# Patient Record
Sex: Male | Born: 1995 | Race: White | Hispanic: No | Marital: Single | Smoking: Never smoker
Health system: Southern US, Community
[De-identification: ages and names within clinical notes are randomized; demographics above are authoritative.]

## PROBLEM LIST (undated history)

## (undated) DIAGNOSIS — K2990 Gastroduodenitis, unspecified, without bleeding: Secondary | ICD-10-CM

## (undated) HISTORY — PX: WISDOM TOOTH EXTRACTION: SHX21

## (undated) HISTORY — DX: Gastroduodenitis, unspecified, without bleeding: K29.90

---
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
## 2014-02-04 IMAGING — US US ABDOMEN LIMITED
1 series · 14 of 25 positions shown · non-contrast
Comparison: None.

CLINICAL DATA: Elevated LFTs

EXAM:
US ABDOMEN LIMITED - RIGHT UPPER QUADRANT

[Series 1: us abdomen limited · 0.17mm/px · 14 of 50 slices shown]
[im 1/50]
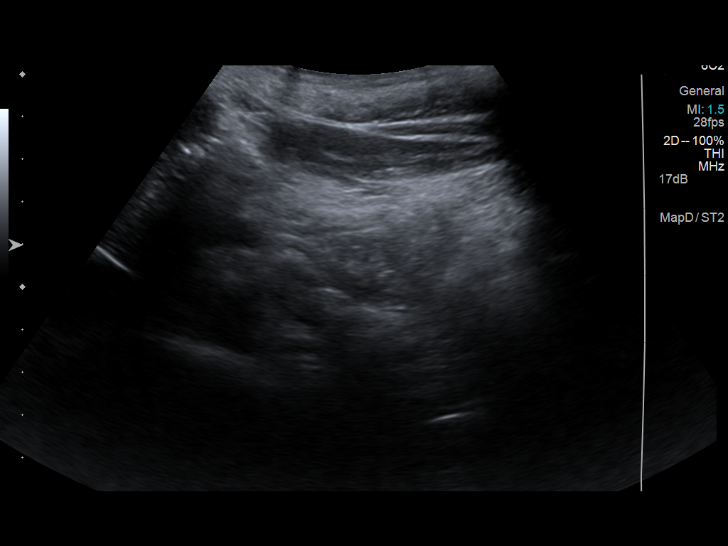
[im 5/50]
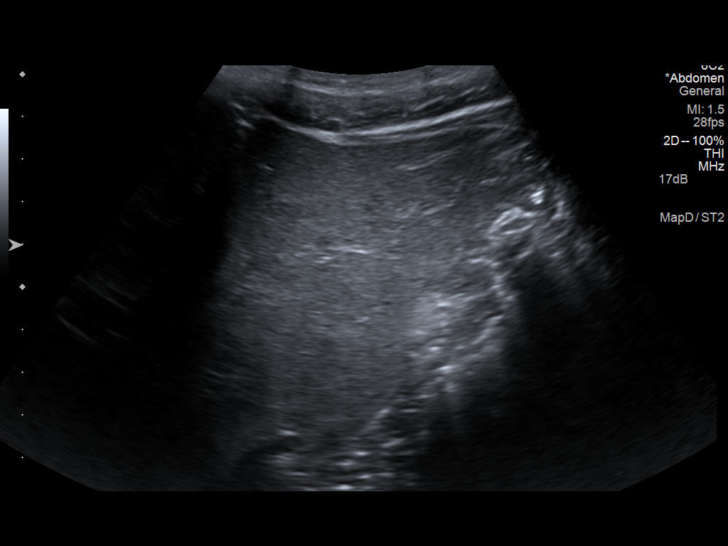
[im 9/50]
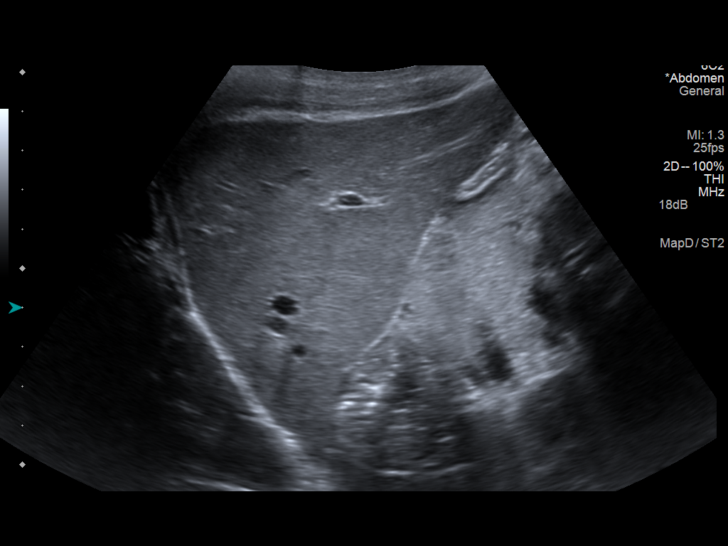
[im 13/50]
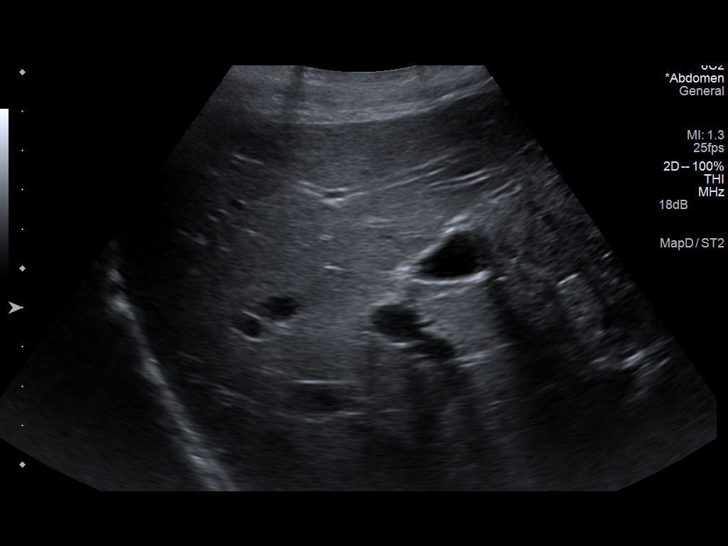
[im 17/50]
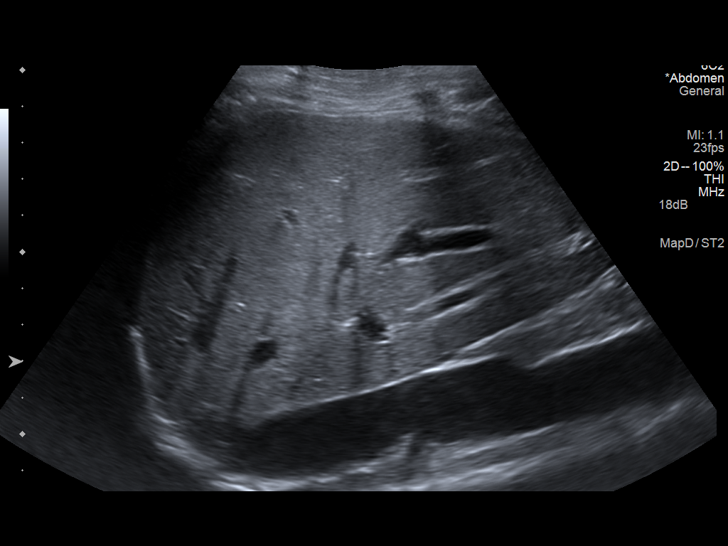
[im 19/50]
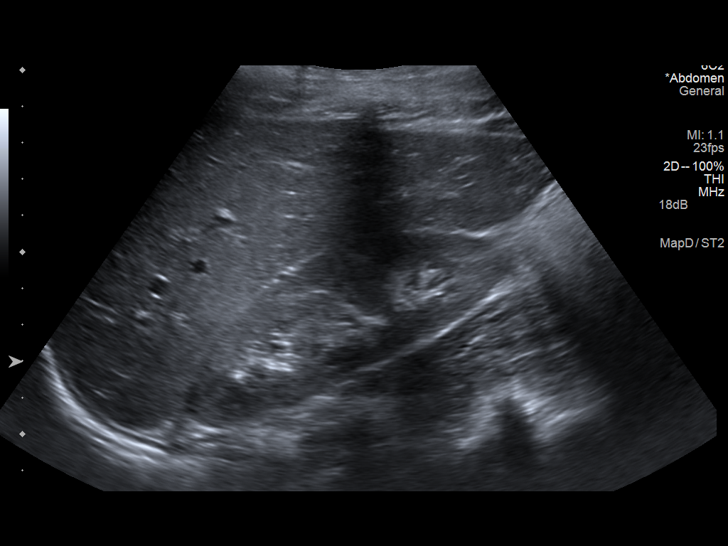
[im 23/50]
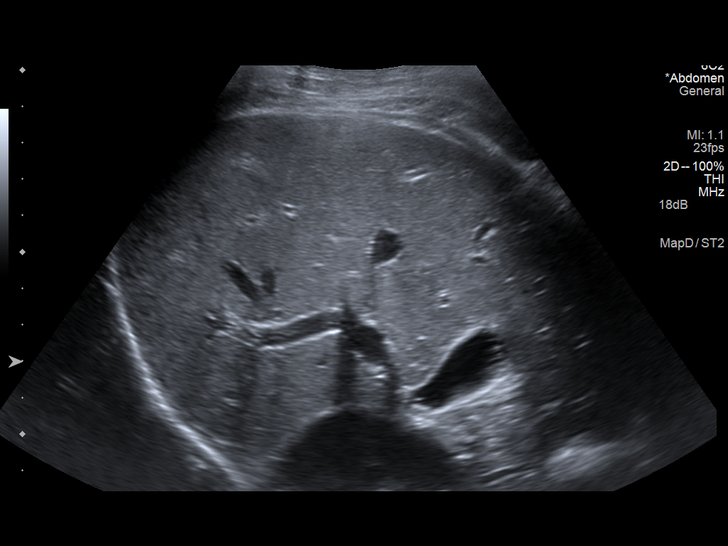
[im 27/50]
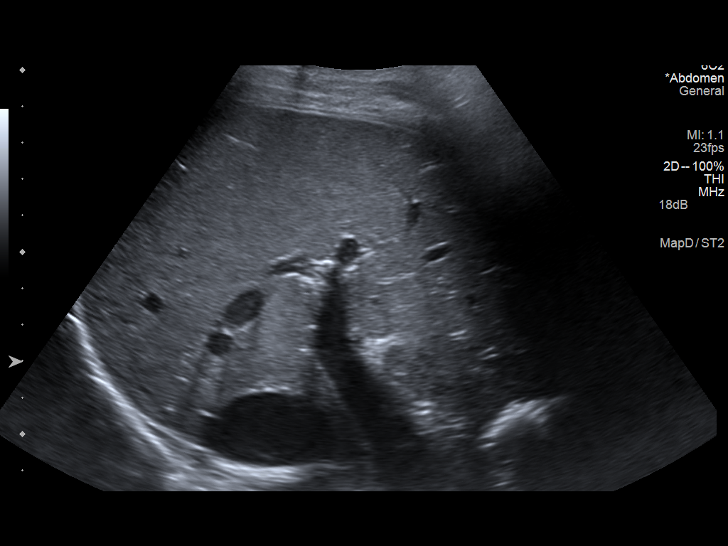
[im 31/50]
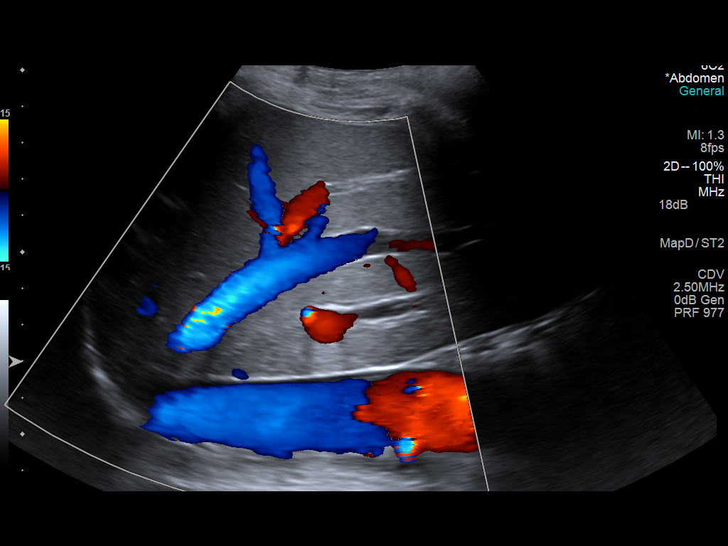
[im 33/50]
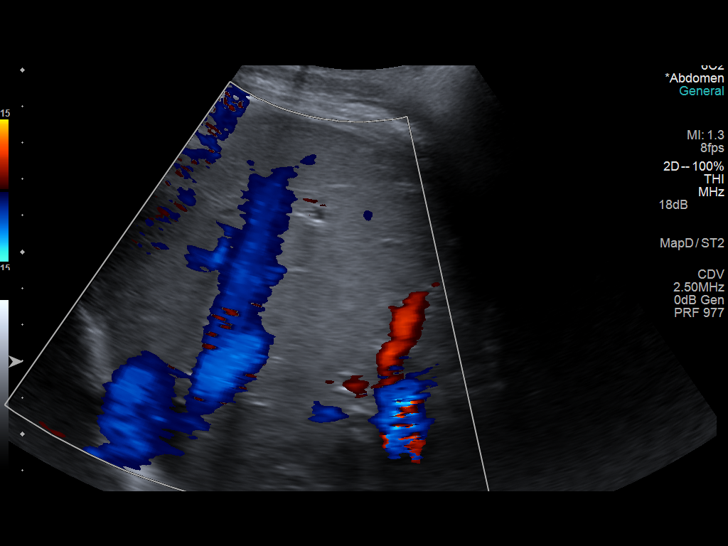
[im 37/50]
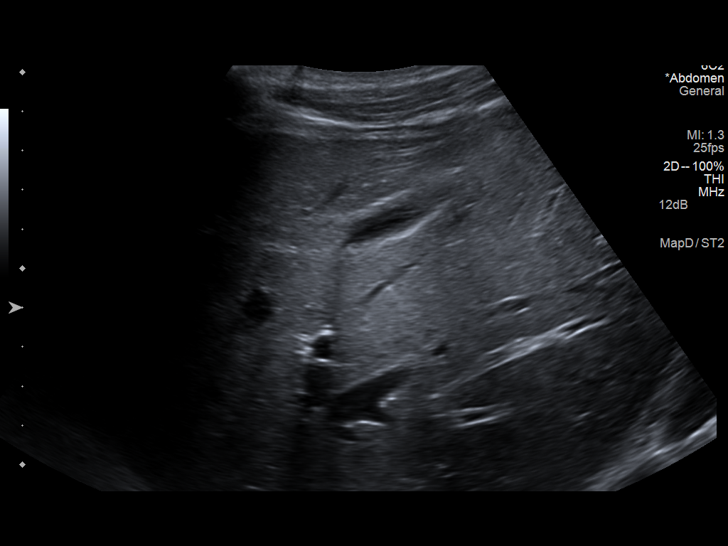
[im 41/50]
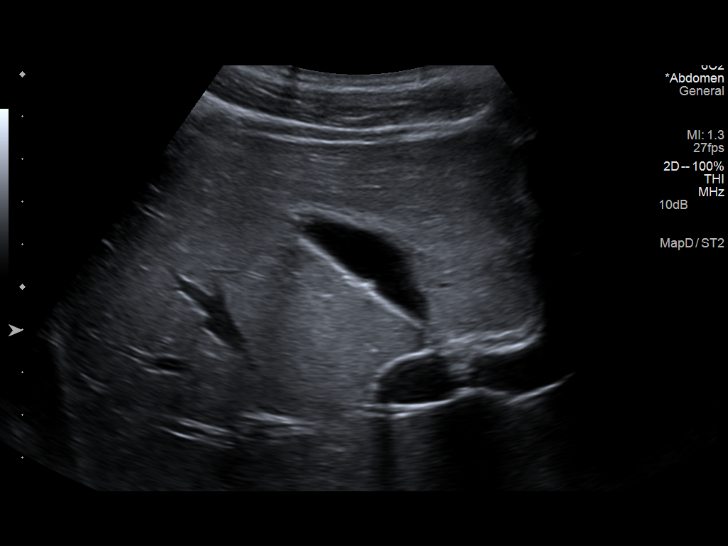
[im 45/50]
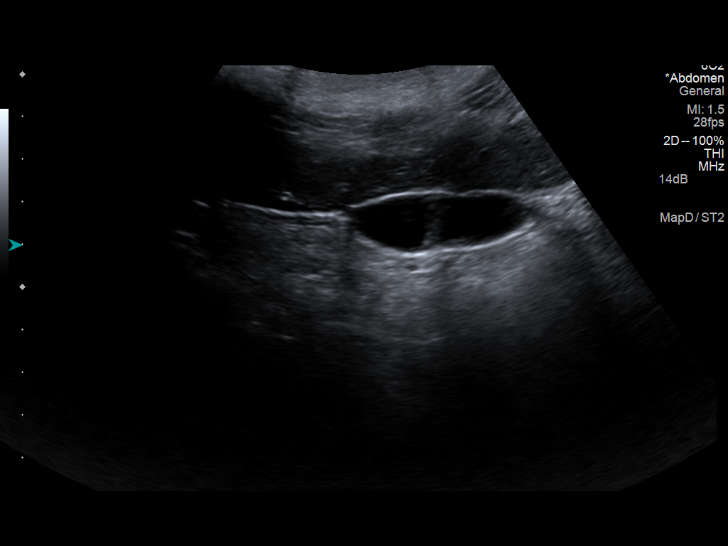
[im 50/50]
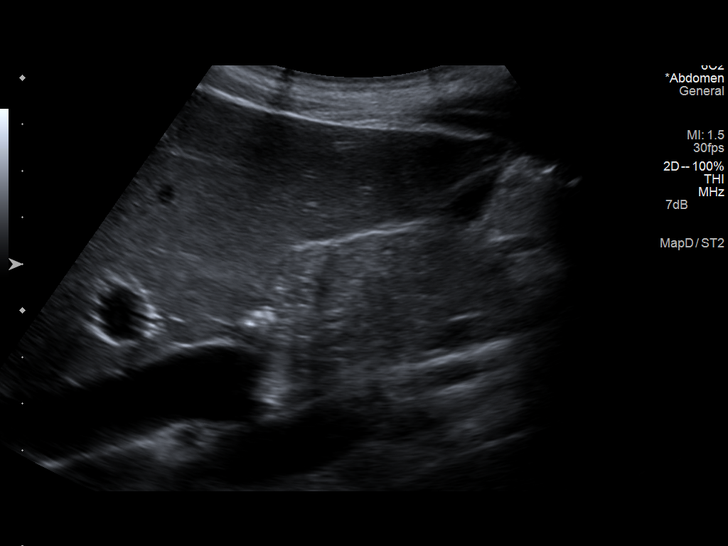

[14 of 25 positions shown; findings below may reference images not displayed]

FINDINGS: Gallbladder

No gallstones or wall thickening visualized, measured at 1.7 mm in
thickness. No sonographic Murphy sign noted.

Common bile duct

Diameter: 1.7 mm

Liver:

No focal lesion identified. Within normal limits in parenchymal
echogenicity.
IMPRESSION: Negative right upper quadrant abdominal ultrasound

## 2014-02-17 ENCOUNTER — Other Ambulatory Visit: Payer: Self-pay

## 2014-02-17 DIAGNOSIS — R7989 Other specified abnormal findings of blood chemistry: Secondary | ICD-10-CM

## 2014-02-17 DIAGNOSIS — R945 Abnormal results of liver function studies: Principal | ICD-10-CM

## 2014-12-01 ENCOUNTER — Encounter (HOSPITAL_COMMUNITY): Payer: Self-pay | Admitting: *Deleted

## 2014-12-01 ENCOUNTER — Emergency Department (HOSPITAL_COMMUNITY)
Admission: EM | Admit: 2014-12-01 | Discharge: 2014-12-01 | Disposition: A | Discharge status: Home / Self Care | Payer: Medicaid Other | Attending: Emergency Medicine | Admitting: Emergency Medicine

## 2014-12-01 ENCOUNTER — Emergency Department (HOSPITAL_COMMUNITY): Payer: Medicaid Other

## 2014-12-01 DIAGNOSIS — R091 Pleurisy: Secondary | ICD-10-CM | POA: Diagnosis not present

## 2014-12-01 DIAGNOSIS — R079 Chest pain, unspecified: Secondary | ICD-10-CM | POA: Diagnosis present

## 2014-12-01 DIAGNOSIS — J45909 Unspecified asthma, uncomplicated: Secondary | ICD-10-CM | POA: Insufficient documentation

## 2014-12-01 DIAGNOSIS — Z79899 Other long term (current) drug therapy: Secondary | ICD-10-CM | POA: Insufficient documentation

## 2014-12-01 LAB — BASIC METABOLIC PANEL
Anion gap: 9 (ref 5–15)
BUN: 15 mg/dL (ref 6–20)
CO2: 27 mmol/L (ref 22–32)
CREATININE: 0.93 mg/dL (ref 0.61–1.24)
Calcium: 9.5 mg/dL (ref 8.9–10.3)
Chloride: 103 mmol/L (ref 101–111)
Glucose, Bld: 91 mg/dL (ref 65–99)
Potassium: 3.8 mmol/L (ref 3.5–5.1)
SODIUM: 139 mmol/L (ref 135–145)

## 2014-12-01 LAB — BRAIN NATRIURETIC PEPTIDE: B NATRIURETIC PEPTIDE 5: 11 pg/mL (ref 0.0–100.0)

## 2014-12-01 LAB — D-DIMER, QUANTITATIVE: D-Dimer, Quant: <0.27 ug/mL-FEU (ref 0.00–0.48)

## 2014-12-01 LAB — TROPONIN I

## 2014-12-01 MED ORDER — DEXAMETHASONE SODIUM PHOSPHATE 10 MG/ML IJ SOLN
10.0000 mg | Freq: Once | INTRAMUSCULAR | Status: AC
  Administered 2014-12-01: 10 mg via INTRAVENOUS
  Filled 2014-12-01: qty 1

## 2014-12-01 MED ORDER — TRAMADOL HCL 50 MG PO TABS
50.0000 mg | ORAL_TABLET | Freq: Once | ORAL | Status: AC
  Administered 2014-12-01: 50 mg via ORAL
  Filled 2014-12-01: qty 1

## 2014-12-01 MED ORDER — TRAMADOL HCL 50 MG PO TABS: 50.0000 mg | ORAL_TABLET | Freq: Four times a day (QID) | ORAL | Status: DC | PRN

## 2014-12-01 MED ORDER — KETOROLAC TROMETHAMINE 30 MG/ML IJ SOLN
30.0000 mg | Freq: Once | INTRAMUSCULAR | Status: AC
  Administered 2014-12-01: 30 mg via INTRAVENOUS
  Filled 2014-12-01: qty 1

## 2014-12-01 MED ORDER — NAPROXEN 500 MG PO TABS: 500.0000 mg | ORAL_TABLET | Freq: Two times a day (BID) | ORAL | Status: DC

## 2014-12-01 NOTE — Discharge Instructions (Signed)
Pleurisy Pleurisy is an inflammation and swelling of the lining of the lungs (pleura). Because of this inflammation, it hurts to breathe. It can be aggravated by coughing, laughing, or deep breathing. Pleurisy is often caused by an underlying infection or disease.  HOME CARE INSTRUCTIONS  Monitor your pleurisy for any changes. The following actions may help to alleviate any discomfort you are experiencing:  Medicine may help with pain. Only take over-the-counter or prescription medicines for pain, discomfort, or fever as directed by your health care provider.  Only take antibiotic medicine as directed. Make sure to finish it even if you start to feel better. SEEK MEDICAL CARE IF:   Your pain is not controlled with medicine or is increasing.  You have an increase in pus-like (purulent) secretions brought up with coughing. SEEK IMMEDIATE MEDICAL CARE IF:   You have blue or dark lips, fingernails, or toenails.  You are coughing up blood.  You have increased difficulty breathing.  You have continuing pain unrelieved by medicine or pain lasting more than 1 week.  You have pain that radiates into your neck, arms, or jaw.  You develop increased shortness of breath or wheezing.  You develop a fever, rash, vomiting, fainting, or other serious symptoms. MAKE SURE YOU:  Understand these instructions.   Will watch your condition.   Will get help right away if you are not doing well or get worse.  Document Released: 05/23/2005 Document Revised: 01/23/2013 Document Reviewed: 11/04/2012 [facility] Patient Information 2015 [place], [place]. This information is not intended to replace advice given to you by your health care provider. Make sure you discuss any questions you have with your health care provider.  Naproxen and naproxen sodium oral immediate-release tablets What is this medicine? NAPROXEN (na PROX en) is a non-steroidal anti-inflammatory drug (NSAID). It is used to reduce swelling  and to treat pain. This medicine may be used for dental pain, headache, or painful monthly periods. It is also used for painful joint and muscular problems such as arthritis, tendinitis, bursitis, and gout. This medicine may be used for other purposes; ask your health care provider or pharmacist if you have questions. COMMON BRAND NAME(S): Aflaxen, Aleve, Aleve Arthritis, All Day Relief, Anaprox, Anaprox DS, Naprosyn What should I tell my health care provider before I take this medicine? They need to know if you have any of these conditions: -asthma -cigarette smoker -drink more than 3 alcohol containing drinks a day -heart disease or circulation problems such as heart failure or leg edema (fluid retention) -high blood pressure -kidney disease -liver disease -stomach bleeding or ulcers -an unusual or allergic reaction to naproxen, aspirin, other NSAIDs, other medicines, foods, dyes, or preservatives -pregnant or trying to get pregnant -breast-feeding How should I use this medicine? Take this medicine by mouth with a glass of water. Follow the directions on the prescription label. Take it with food if your stomach gets upset. Try to not lie down for at least 10 minutes after you take it. Take your medicine at regular intervals. Do not take your medicine more often than directed. Long-term, continuous use may increase the risk of heart attack or stroke. A special MedGuide will be given to you by the pharmacist with each prescription and refill. Be sure to read this information carefully each time. Talk to your pediatrician regarding the use of this medicine in children. Special care may be needed. Overdosage: If you think you have taken too much of this medicine contact a poison control center or  emergency room at once. NOTE: This medicine is only for you. Do not share this medicine with others. What if I miss a dose? If you miss a dose, take it as soon as you can. If it is almost time for your  next dose, take only that dose. Do not take double or extra doses. What may interact with this medicine? -alcohol -aspirin -cidofovir -diuretics -lithium -methotrexate -other drugs for inflammation like ketorolac or prednisone -pemetrexed -probenecid -warfarin This list may not describe all possible interactions. Give your health care provider a list of all the medicines, herbs, non-prescription drugs, or dietary supplements you use. Also tell them if you smoke, drink alcohol, or use illegal drugs. Some items may interact with your medicine. What should I watch for while using this medicine? Tell your doctor or health care professional if your pain does not get better. Talk to your doctor before taking another medicine for pain. Do not treat yourself. This medicine does not prevent heart attack or stroke. In fact, this medicine may increase the chance of a heart attack or stroke. The chance may increase with longer use of this medicine and in people who have heart disease. If you take aspirin to prevent heart attack or stroke, talk with your doctor or health care professional. Do not take other medicines that contain aspirin, ibuprofen, or naproxen with this medicine. Side effects such as stomach upset, nausea, or ulcers may be more likely to occur. Many medicines available without a prescription should not be taken with this medicine. This medicine can cause ulcers and bleeding in the stomach and intestines at any time during treatment. Do not smoke cigarettes or drink alcohol. These increase irritation to your stomach and can make it more susceptible to damage from this medicine. Ulcers and bleeding can happen without warning symptoms and can cause death. You may get drowsy or dizzy. Do not drive, use machinery, or do anything that needs mental alertness until you know how this medicine affects you. Do not stand or sit up quickly, especially if you are an older patient. This reduces the risk of  dizzy or fainting spells. This medicine can cause you to bleed more easily. Try to avoid damage to your teeth and gums when you brush or floss your teeth. What side effects may I notice from receiving this medicine? Side effects that you should report to your doctor or health care professional as soon as possible: -black or bloody stools, blood in the urine or vomit -blurred vision -chest pain -difficulty breathing or wheezing -nausea or vomiting -severe stomach pain -skin rash, skin redness, blistering or peeling skin, hives, or itching -slurred speech or weakness on one side of the body -swelling of eyelids, throat, lips -unexplained weight gain or swelling -unusually weak or tired -yellowing of eyes or skin Side effects that usually do not require medical attention (report to your doctor or health care professional if they continue or are bothersome): -constipation -headache -heartburn This list may not describe all possible side effects. Call your doctor for medical advice about side effects. You may report side effects to FDA at 1-800-FDA-1088. Where should I keep my medicine? Keep out of the reach of children. Store at room temperature between 15 and 30 degrees C (59 and 86 degrees F). Keep container tightly closed. Throw away any unused medicine after the expiration date. NOTE: This sheet is a summary. It may not cover all possible information. If you have questions about this medicine, talk to your doctor,  pharmacist, or health care provider.  2015, Elsevier/Gold Standard. (2009-05-25 20:10:16)  Tramadol tablets What is this medicine? TRAMADOL (TRA ma dole) is a pain reliever. It is used to treat moderate to severe pain in adults. This medicine may be used for other purposes; ask your health care provider or pharmacist if you have questions. COMMON BRAND NAME(S): Ultram What should I tell my health care provider before I take this medicine? They need to know if you have any  of these conditions: -brain tumor -depression -drug abuse or addiction -head injury -if you frequently drink alcohol containing drinks -kidney disease or trouble passing urine -liver disease -lung disease, asthma, or breathing problems -seizures or epilepsy -suicidal thoughts, plans, or attempt; a previous suicide attempt by you or a family member -an unusual or allergic reaction to tramadol, codeine, other medicines, foods, dyes, or preservatives -pregnant or trying to get pregnant -breast-feeding How should I use this medicine? Take this medicine by mouth with a full glass of water. Follow the directions on the prescription label. If the medicine upsets your stomach, take it with food or milk. Do not take more medicine than you are told to take. Talk to your pediatrician regarding the use of this medicine in children. Special care may be needed. Overdosage: If you think you have taken too much of this medicine contact a poison control center or emergency room at once. NOTE: This medicine is only for you. Do not share this medicine with others. What if I miss a dose? If you miss a dose, take it as soon as you can. If it is almost time for your next dose, take only that dose. Do not take double or extra doses. What may interact with this medicine? Do not take this medicine with any of the following medications: -MAOIs like Carbex, Eldepryl, Marplan, Nardil, and Parnate This medicine may also interact with the following medications: -alcohol or medicines that contain alcohol -antihistamines -benzodiazepines -bupropion -carbamazepine or oxcarbazepine -clozapine -cyclobenzaprine -digoxin -furazolidone -linezolid -medicines for depression, anxiety, or psychotic disturbances -medicines for migraine headache like almotriptan, eletriptan, frovatriptan, naratriptan, rizatriptan, sumatriptan, zolmitriptan -medicines for pain like pentazocine, buprenorphine, butorphanol, meperidine,  nalbuphine, and propoxyphene -medicines for sleep -muscle relaxants -naltrexone -phenobarbital -phenothiazines like perphenazine, thioridazine, chlorpromazine, mesoridazine, fluphenazine, prochlorperazine, promazine, and trifluoperazine -procarbazine -warfarin This list may not describe all possible interactions. Give your health care provider a list of all the medicines, herbs, non-prescription drugs, or dietary supplements you use. Also tell them if you smoke, drink alcohol, or use illegal drugs. Some items may interact with your medicine. What should I watch for while using this medicine? Tell your doctor or health care professional if your pain does not go away, if it gets worse, or if you have new or a different type of pain. You may develop tolerance to the medicine. Tolerance means that you will need a higher dose of the medicine for pain relief. Tolerance is normal and is expected if you take this medicine for a long time. Do not suddenly stop taking your medicine because you may develop a severe reaction. Your body becomes used to the medicine. This does NOT mean you are addicted. Addiction is a behavior related to getting and using a drug for a non-medical reason. If you have pain, you have a medical reason to take pain medicine. Your doctor will tell you how much medicine to take. If your doctor wants you to stop the medicine, the dose will be slowly lowered over time to avoid  any side effects. You may get drowsy or dizzy. Do not drive, use machinery, or do anything that needs mental alertness until you know how this medicine affects you. Do not stand or sit up quickly, especially if you are an older patient. This reduces the risk of dizzy or fainting spells. Alcohol can increase or decrease the effects of this medicine. Avoid alcoholic drinks. You may have constipation. Try to have a bowel movement at least every 2 to 3 days. If you do not have a bowel movement for 3 days, call your doctor  or health care professional. Your mouth may get dry. Chewing sugarless gum or sucking hard candy, and drinking plenty of water may help. Contact your doctor if the problem does not go away or is severe. What side effects may I notice from receiving this medicine? Side effects that you should report to your doctor or health care professional as soon as possible: -allergic reactions like skin rash, itching or hives, swelling of the face, lips, or tongue -breathing difficulties, wheezing -confusion -itching -light headedness or fainting spells -redness, blistering, peeling or loosening of the skin, including inside the mouth -seizures Side effects that usually do not require medical attention (report to your doctor or health care professional if they continue or are bothersome): -constipation -dizziness -drowsiness -headache -nausea, vomiting This list may not describe all possible side effects. Call your doctor for medical advice about side effects. You may report side effects to FDA at 1-800-FDA-1088. Where should I keep my medicine? Keep out of the reach of children. Store at room temperature between 15 and 30 degrees C (59 and 86 degrees F). Keep container tightly closed. Throw away any unused medicine after the expiration date. NOTE: This sheet is a summary. It may not cover all possible information. If you have questions about this medicine, talk to your doctor, pharmacist, or health care provider.  2015, Elsevier/Gold Standard. (2010-02-03 11:55:44)

## 2014-12-01 NOTE — ED Provider Notes (Signed)
CSN: [id number]     Arrival date & time 12/01/14  0159 History   First MD Initiated Contact with Patient 12/01/14 0229     Chief Complaint  Patient presents with  . Chest Pain     (Consider location/radiation/quality/duration/timing/severity/associated sxs/prior Treatment) Patient is a 19 y.o. male presenting with chest pain. The history is provided by the patient.  Chest Pain He had sudden onset of left anterior chest pain about 3 ounces sig oh. Pain started while he was watching television. It is worse with a deep breath but not worse with movement. Some radiation of pain towards his left shoulder. He denies associated dyspnea, nausea, diaphoresis. There's been no cough or fever. He's never had pain like this before. He has not tried anything to treat it. He denies any recent surgery or long distance travel and is a nonsmoker. He does have history of asthma.  Past Medical History  Diagnosis Date  . Asthma    Past Surgical History  Procedure Laterality Date  . Wisdom tooth extraction     Family History  Problem Relation Age of Onset  . Asthma    . Colon cancer Neg Hx    History  Substance Use Topics  . Smoking status: Never Smoker   . Smokeless tobacco: Not on file  . Alcohol Use: No    Review of Systems  Cardiovascular: Positive for chest pain.  All other systems reviewed and are negative.     Allergies  Review of patient's allergies indicates no known allergies.  Home Medications   Prior to Admission medications   Medication Sig Start Date End Date Taking? Authorizing Provider  albuterol (PROVENTIL) (2.5 MG/3ML) 0.083% nebulizer solution Take 2.5 mg by nebulization every 6 (six) hours as needed. For shortness of breath    Yes Historical Provider, MD  albuterol (VENTOLIN HFA) 108 (90 BASE) MCG/ACT inhaler Inhale 2 puffs into the lungs daily as needed. For shortness of breath    Yes Historical Provider, MD  cetirizine (ZYRTEC) 10 MG tablet Take 10 mg by mouth at  bedtime.     Yes Historical Provider, MD  montelukast (SINGULAIR) 5 MG chewable tablet Chew 5 mg by mouth daily.     Yes Historical Provider, MD   BP 148/94 mmHg  Pulse 95  Temp(Src) 98.3 F (36.8 C) (Oral)  Resp 18  Ht 5\' 8"  (1.727 m)  Wt 125 lb (56.7 kg)  BMI 19.01 kg/m2  SpO2 100% Physical Exam  Nursing note and vitals reviewed.  19 year old male, resting comfortably and in no acute distress. Vital signs are significant for hypertension. Oxygen saturation is 100%, which is normal. Head is normocephalic and atraumatic. PERRLA, EOMI. Oropharynx is clear. Neck is nontender and supple without adenopathy or JVD. Back is nontender and there is no CVA tenderness. Lungs are clear without rales, wheezes, or rhonchi. Chest is nontender. Heart has regular rate and rhythm without murmur. Abdomen is soft, flat, nontender without masses or hepatosplenomegaly and peristalsis is normoactive. Extremities have no cyanosis or edema, full range of motion is present. Skin is warm and dry without rash. Neurologic: Mental status is normal, cranial nerves are intact, there are no motor or sensory deficits.  ED Course  Procedures (including critical care time) Labs Review Results for orders placed or performed during the hospital encounter of 12/01/14  BNP (order ONLY if patient complains of dyspnea/SOB AND you have documented it for THIS visit)  Result Value Ref Range   B Natriuretic Peptide 11.0  0.0 - 100.0 pg/mL  Basic metabolic panel  Result Value Ref Range   Sodium 139 135 - 145 mmol/L   Potassium 3.8 3.5 - 5.1 mmol/L   Chloride 103 101 - 111 mmol/L   CO2 27 22 - 32 mmol/L   Glucose, Bld 91 65 - 99 mg/dL   BUN 15 6 - 20 mg/dL   Creatinine, Ser 8.11 0.61 - 1.24 mg/dL   Calcium 9.5 8.9 - 91.4 mg/dL   GFR calc non Af Amer >60 >60 mL/min   GFR calc Af Amer >60 >60 mL/min   Anion gap 9 5 - 15  Troponin I  Result Value Ref Range   Troponin I <0.03 <0.031 ng/mL  D-dimer, quantitative   Result Value Ref Range   D-Dimer, Quant <0.27 0.00 - 0.48 ug/mL-FEU  Imaging Review Dg Chest Portable 1 View  12/01/2014   CLINICAL DATA:  Sharp left-sided chest pain, pleuritic. Onset tonight.  EXAM: PORTABLE CHEST - 1 VIEW  COMPARISON:  04/27/2011  FINDINGS: A single AP portable view of the chest demonstrates no focal airspace consolidation or alveolar edema. The lungs are grossly clear. There is no large effusion or pneumothorax. Cardiac and mediastinal contours appear unremarkable.  IMPRESSION: No active disease.   Electronically Signed   By: Ellery Plunk M.D.   On: 12/01/2014 02:47   Images viewed by me.    EKG Interpretation   Date/Time:  Monday December 01 2014 02:15:41 EDT Ventricular Rate:  100 PR Interval:  190 QRS Duration: 97 QT Interval:  353 QTC Calculation: 455 R Axis:   85 Text Interpretation:  Sinus tachycardia Otherwise within normal limits No  old tracing to compare Confirmed by [facility]  MD, Evander Macaraeg ([id number]) on 12/01/2014  2:39:18 AM      MDM   Final diagnoses:  Pleurisy    Pleuritic chest pain. He will given a therapeutic trial of ketorolac and screening labs and x-rays are ordered.  X-ray is unremarkable and laboratory workup is normal including normal d-dimer. He had only very slight relief of pain from ketorolac. He is given a dose of dexamethasone and discharged with prescriptions for naproxen and tramadol. Follow-up with PCP if not improving.  Dione Booze, MD 12/01/14 0400

## 2014-12-01 NOTE — ED Notes (Signed)
Pt states he was watching tv and started having sharp chest pains on the left side. Pain worse when taking a deep breath. Pt been having some problems w/ his asthma today also.

## 2015-02-05 ENCOUNTER — Encounter: Payer: Self-pay | Admitting: Gastroenterology

## 2015-02-25 ENCOUNTER — Ambulatory Visit (INDEPENDENT_AMBULATORY_CARE_PROVIDER_SITE_OTHER): Payer: Medicaid Other | Admitting: Gastroenterology

## 2015-02-25 ENCOUNTER — Encounter: Payer: Self-pay | Admitting: Gastroenterology

## 2015-02-25 ENCOUNTER — Other Ambulatory Visit: Payer: Self-pay

## 2015-02-25 VITALS — BP 122/71 | HR 64 | Temp 98.0°F | Ht 67.0 in | Wt 130.0 lb

## 2015-02-25 DIAGNOSIS — R1013 Epigastric pain: Secondary | ICD-10-CM

## 2015-02-25 MED ORDER — PANTOPRAZOLE SODIUM 40 MG PO TBEC: 40.0000 mg | DELAYED_RELEASE_TABLET | Freq: Every day | ORAL | Status: DC

## 2015-02-25 NOTE — Progress Notes (Signed)
Primary Care Physician:  Erasmo Downer, NP Primary Gastroenterologist:  Dr. Darrick Penna   Chief Complaint  Patient presents with  . Nausea    HPI:   William Burnett is a 19 y.o. male presenting today at the request of his PCP secondary to nausea.   Vague symptoms. Feels sick after eating. +Nausea. Occasional vomiting. No decreased appetite. No reflux. Sometimes feels like something is hung in his throat. No melena. No NSAIDs or aspirin powders. Sometimes pain a few inches above umbilicus after eating. Nausea only after eating. Pain usually after eating. Worse with [place] food.   Past Medical History  Diagnosis Date  . Asthma     Past Surgical History  Procedure Laterality Date  . Wisdom tooth extraction      Current Outpatient Prescriptions  Medication Sig Dispense Refill  . albuterol (PROVENTIL) (2.5 MG/3ML) 0.083% nebulizer solution Take 2.5 mg by nebulization every 6 (six) hours as needed. For shortness of breath     . albuterol (VENTOLIN HFA) 108 (90 BASE) MCG/ACT inhaler Inhale 2 puffs into the lungs daily as needed. For shortness of breath     . cetirizine (ZYRTEC) 10 MG tablet Take 10 mg by mouth at bedtime.      . montelukast (SINGULAIR) 5 MG chewable tablet Chew 5 mg by mouth daily.      . pantoprazole (PROTONIX) 40 MG tablet Take 1 tablet (40 mg total) by mouth daily. 30 tablet 3   No current facility-administered medications for this visit.    Allergies as of 02/25/2015  . (No Known Allergies)    Family History  Problem Relation Age of Onset  . Asthma    . Colon cancer Neg Hx   . Colon polyps Maternal Grandfather     Social History   Social History  . Marital Status: Single    Spouse Name: N/A  . Number of Children: N/A  . Years of Education: N/A   Occupational History  . student     [employer]   Social History Main Topics  . Smoking status: Never Smoker   . Smokeless tobacco: Not on file  . Alcohol Use: No  . Drug Use: No  . Sexual Activity: Not  on file   Other Topics Concern  . Not on file   Social History Narrative    Review of Systems: Gen: see HPI CV: Denies chest pain, heart palpitations, peripheral edema, syncope.  Resp: +wheezing  GI: see HPI GU : Denies urinary burning, urinary frequency, urinary hesitancy MS: Denies joint pain, muscle weakness, cramps, or limitation of movement.  Derm: Denies rash, itching, dry skin Psych: Denies depression, anxiety, memory loss, and confusion Heme: Denies bruising, bleeding, and enlarged lymph nodes.  Physical Exam: BP 122/71 mmHg  Pulse 64  Temp(Src) 98 F (36.7 C) (Oral)  Ht  (1.702 m)  Wt 130 lb (58.968 kg)  BMI 20.36 kg/m2 General:   Alert and oriented. Pleasant and cooperative. Well-nourished and well-developed.  Head:  Normocephalic and atraumatic. Eyes:  Without icterus, sclera clear and conjunctiva pink.  Ears:  Normal auditory acuity. Nose:  No deformity, discharge,  or lesions. Mouth:  No deformity or lesions, oral mucosa pink.  Lungs:  Clear to auscultation bilaterally. No wheezes, rales, or rhonchi. No distress.  Heart:  S1, S2 present without murmurs appreciated.  Abdomen:  +BS, soft, non-tender and non-distended. No HSM noted. No guarding or rebound. No masses appreciated.  Rectal:  Deferred  Msk:  Symmetrical without  gross deformities. Normal posture. Extremities:  Without  edema. Neurologic:  Alert and  oriented x4;  grossly normal neurologically. Psych:  Alert and cooperative. Normal mood and affect.

## 2015-02-25 NOTE — Patient Instructions (Signed)
We have started you on Protonix once a day, 30 minutes before breakfast.   We have scheduled you for an upper endoscopy and possible dilation with Dr. Darrick Penna.

## 2015-03-01 DIAGNOSIS — R1013 Epigastric pain: Secondary | ICD-10-CM | POA: Insufficient documentation

## 2015-03-01 NOTE — Assessment & Plan Note (Signed)
19 year old male with vague nausea and epigastric discomfort exacerbated by eating. No NSAIDs or aspirin powders. Occasionally globus sensation, solid food dysphagia. May have underlying GERD, gastritis, doubt biliary etiology. Start Protonix once daily and proceed with EGD. Vague dysphagia may be secondary to GERD, possible web, ring, stricture.   Proceed with upper endoscopy with possible dilation  in the near future with Dr. Darrick Penna. The risks, benefits, and alternatives have been discussed in detail with patient. They have stated understanding and desire to proceed.

## 2015-03-02 NOTE — Progress Notes (Signed)
CC'D TO PCP °

## 2015-03-18 ENCOUNTER — Encounter (HOSPITAL_COMMUNITY)
Admission: RE | Disposition: A | Discharge status: Home / Self Care | Payer: Self-pay | Source: Ambulatory Visit | Attending: Gastroenterology

## 2015-03-18 ENCOUNTER — Ambulatory Visit (HOSPITAL_COMMUNITY)
Admission: RE | Admit: 2015-03-18 | Discharge: 2015-03-18 | Disposition: A | Discharge status: Home / Self Care | Payer: Medicaid Other | Source: Ambulatory Visit | Attending: Gastroenterology | Admitting: Gastroenterology

## 2015-03-18 ENCOUNTER — Encounter (HOSPITAL_COMMUNITY): Payer: Self-pay | Admitting: *Deleted

## 2015-03-18 DIAGNOSIS — K2970 Gastritis, unspecified, without bleeding: Secondary | ICD-10-CM

## 2015-03-18 DIAGNOSIS — J45909 Unspecified asthma, uncomplicated: Secondary | ICD-10-CM | POA: Diagnosis not present

## 2015-03-18 DIAGNOSIS — K2980 Duodenitis without bleeding: Secondary | ICD-10-CM | POA: Insufficient documentation

## 2015-03-18 DIAGNOSIS — K222 Esophageal obstruction: Secondary | ICD-10-CM | POA: Diagnosis not present

## 2015-03-18 DIAGNOSIS — R1310 Dysphagia, unspecified: Secondary | ICD-10-CM | POA: Diagnosis not present

## 2015-03-18 DIAGNOSIS — K2950 Unspecified chronic gastritis without bleeding: Secondary | ICD-10-CM | POA: Diagnosis not present

## 2015-03-18 DIAGNOSIS — R1013 Epigastric pain: Secondary | ICD-10-CM | POA: Diagnosis not present

## 2015-03-18 DIAGNOSIS — Z79899 Other long term (current) drug therapy: Secondary | ICD-10-CM | POA: Insufficient documentation

## 2015-03-18 HISTORY — PX: ESOPHAGOGASTRODUODENOSCOPY: SHX5428

## 2015-03-18 HISTORY — PX: SAVORY DILATION: SHX5439

## 2015-03-18 SURGERY — EGD (ESOPHAGOGASTRODUODENOSCOPY)
Anesthesia: Moderate Sedation

## 2015-03-18 MED ORDER — MIDAZOLAM HCL 5 MG/5ML IJ SOLN
INTRAMUSCULAR | Status: AC
  Filled 2015-03-18: qty 10

## 2015-03-18 MED ORDER — MEPERIDINE HCL 100 MG/ML IJ SOLN
INTRAMUSCULAR | Status: DC | PRN
  Administered 2015-03-18: 25 mg
  Administered 2015-03-18 (×3): 25 mg via INTRAVENOUS

## 2015-03-18 MED ORDER — PROMETHAZINE HCL 25 MG/ML IJ SOLN
INTRAMUSCULAR | Status: AC
  Filled 2015-03-18: qty 1

## 2015-03-18 MED ORDER — LIDOCAINE VISCOUS 2 % MT SOLN
OROMUCOSAL | Status: AC
  Filled 2015-03-18: qty 15

## 2015-03-18 MED ORDER — SODIUM CHLORIDE 0.9 % IV SOLN
INTRAVENOUS | Status: DC
  Administered 2015-03-18: 09:00:00 via INTRAVENOUS

## 2015-03-18 MED ORDER — PROMETHAZINE HCL 25 MG/ML IJ SOLN
12.5000 mg | Freq: Once | INTRAMUSCULAR | Status: AC
  Administered 2015-03-18: 12.5 mg via INTRAVENOUS

## 2015-03-18 MED ORDER — MEPERIDINE HCL 100 MG/ML IJ SOLN
INTRAMUSCULAR | Status: AC
  Filled 2015-03-18: qty 2

## 2015-03-18 MED ORDER — MINERAL OIL PO OIL
TOPICAL_OIL | ORAL | Status: AC
  Filled 2015-03-18: qty 30

## 2015-03-18 MED ORDER — LIDOCAINE VISCOUS 2 % MT SOLN
OROMUCOSAL | Status: DC | PRN
Start: 2015-03-18 — End: 2015-03-18
  Administered 2015-03-18: 1 via OROMUCOSAL

## 2015-03-18 MED ORDER — SODIUM CHLORIDE 0.9 % IJ SOLN
INTRAMUSCULAR | Status: AC
  Filled 2015-03-18: qty 3

## 2015-03-18 MED ORDER — MIDAZOLAM HCL 5 MG/5ML IJ SOLN
INTRAMUSCULAR | Status: DC | PRN
  Administered 2015-03-18 (×4): 2 mg via INTRAVENOUS

## 2015-03-18 NOTE — Discharge Instructions (Signed)
I dilated your esophagus. You have a stricture near the base of your esophagus.  You have an irritated stomach(gastritis) and small bowel(duodenitis). . I biopsied your stomach and small bowel.   AVOID things THAT TRIGGERS REFLUX and gastritis. SEE INFO BELOW.   CONTINUE PROTONIX. TAKE 30 MINUTES PRIOR TO BREAKFAST.  YOUR BIOPSY RESULTS WILL BE AVAILABLE IN MY CHART AFTER  Oct 14 and MY OFFICE WILL CONTACT YOU IN 10-14 DAYS WITH YOUR RESULTS.   FOLLOW UP IN 4 MOS.  UPPER ENDOSCOPY AFTER CARE Read the instructions outlined below and refer to this sheet in the next week. These discharge instructions provide you with general information on caring for yourself after you leave the hospital. While your treatment has been planned according to the most current medical practices available, unavoidable complications occasionally occur. If you have any problems or questions after discharge, call DR. Fina Heizer, [phone].  ACTIVITY  You may resume your regular activity, but move at a slower pace for the next 24 hours.   Take frequent rest periods for the next 24 hours.   Walking will help get rid of the air and reduce the bloated feeling in your belly (abdomen).   No driving for 24 hours (because of the medicine (anesthesia) used during the test).   You may shower.   Do not sign any important legal documents or operate any machinery for 24 hours (because of the anesthesia used during the test).    NUTRITION  Drink plenty of fluids.   You may resume your normal diet as instructed by your doctor.   Begin with a light meal and progress to your normal diet. Heavy or fried foods are harder to digest and may make you feel sick to your stomach (nauseated).   Avoid alcoholic beverages for 24 hours or as instructed.    MEDICATIONS  You may resume your normal medications.   WHAT YOU CAN EXPECT TODAY  Some feelings of bloating in the abdomen.   Passage of more gas than usual.    IF YOU  HAD A BIOPSY TAKEN DURING THE UPPER ENDOSCOPY:  Eat a soft diet IF YOU HAVE NAUSEA, BLOATING, ABDOMINAL PAIN, OR VOMITING.    FINDING OUT THE RESULTS OF YOUR TEST Not all test results are available during your visit. DR. Darrick PennaFIELDS WILL CALL YOU WITHIN 14 DAYS OF YOUR PROCEDUE WITH YOUR RESULTS. Do not assume everything is normal if you have not heard from DR. Lucindy Borel, CALL HER OFFICE AT [phone].  SEEK IMMEDIATE MEDICAL ATTENTION AND CALL THE OFFICE: [phone] IF:  You have more than a spotting of blood in your stool.   Your belly is swollen (abdominal distention).   You are nauseated or vomiting.   You have a temperature over 101F.   You have abdominal pain or discomfort that is severe or gets worse throughout the day.  Gastritis  Gastritis is an inflammation (the body's way of reacting to injury and/or infection) of the stomach. It is often caused by viral or bacterial (germ) infections. It can also be caused BY ASPIRIN, BC/GOODY POWDER'S, (IBUPROFEN) MOTRIN, OR ALEVE (NAPROXEN), chemicals (including alcohol), SPICY FOODS, and medications. This illness may be associated with generalized malaise (feeling tired, not well), UPPER ABDOMINAL STOMACH cramps, and fever. One common bacterial cause of gastritis is an organism known as H. Pylori. This can be treated with antibiotics.   ESOPHAGEAL STRICTURE  Esophageal strictures can be caused by stomach acid backing up into the tube that carries food from the mouth  down to the stomach (lower esophagus).  TREATMENT There are a number of medicines used to treat reflux/stricture, including: Antacids.  Proton-pump inhibitors:PROTONIX  HOME CARE INSTRUCTIONS Eat 2-3 hours before going to bed.  Try to reach and maintain a healthy weight.  Do not eat just a few very large meals. Instead, eat 4 TO 6 smaller meals throughout the day.  Try to identify foods and beverages that make your symptoms worse, and avoid these.  Avoid tight clothing.    Do not exercise right after eating.   Lifestyle and home remedies to manage esophageal strictures/reflux You may eliminate or reduce the frequency of heartburn by making the following lifestyle changes:   Control your weight. Being overweight is a major risk factor for heartburn and GERD. Excess pounds put pressure on your abdomen, pushing up your stomach and causing acid to back up into your esophagus.    Eat 4 TO 6 MEALS A DAY. This reduces pressure on the lower esophageal sphincter, helping to prevent the valve from opening and acid from washing back into your esophagus.    Loosen your belt. Clothes that fit tightly around your waist put pressure on your abdomen and the lower esophageal sphincter.    Eliminate heartburn triggers. Everyone has specific triggers. Common triggers such as fatty or fried foods, spicy food, tomato sauce, carbonated beverages, alcohol, chocolate, mint, garlic, onion, caffeine and nicotine may make heartburn worse.    Avoid stooping or bending. Tying your shoes is OK. Bending over for longer periods to weed your garden isn't, especially soon after eating.    Don't lie down after a meal. Wait at least three to four hours after eating before going to bed, and don't lie down right after eating.   Alternative medicine  Several home remedies exist for treating GERD, but they provide only temporary relief. They include drinking baking soda (sodium bicarbonate) added to water or drinking other fluids such as baking soda mixed with cream of tartar and water.  Although these liquids create temporary relief by neutralizing, washing away or buffering acids, eventually they aggravate the situation by adding gas and fluid to your stomach, increasing pressure and causing more acid reflux. Further, adding more sodium to your diet may increase your blood pressure and add stress to your heart, and excessive bicarbonate ingestion can alter the acid-base balance in your  body.

## 2015-03-18 NOTE — Op Note (Signed)
[facility] [address] [place], [id number]   ENDOSCOPY PROCEDURE REPORT  PATIENT: William Burnett, William Burnett  MR#: [id number] BIRTHDATE: [birth year] , 19  yrs. old GENDER: male  ENDOSCOPIST: West BaliSandi L Jerlyn Pain, MD REFFERED OZ:[id number]:Lindsey Shonna ChockF Strader, NP  PROCEDURE DATE:  03/18/2015 PROCEDURE:   EGD with biopsy and EGD with dilatation over guidewire   INDICATIONS:1.  dysphagia.   2.  dyspepsia. MEDICATIONS: Demerol 100 mg IV and Versed 8 mg IV TOPICAL ANESTHETIC: Viscous Xylocaine  DESCRIPTION OF PROCEDURE:   After the risks benefits and alternatives of the procedure were thoroughly explained, informed consent was obtained.  The EG-[id number] ([id number])  endoscope was introduced through the mouth and advanced to the second portion of the duodenum. The instrument was slowly withdrawn as the mucosa was carefully examined.  Prior to withdrawal of the scope, the guidwire was placed.  The esophagus was dilated successfully.  The patient was recovered in endoscopy and discharged home in satisfactory condition. Estimated blood loss is zero unless otherwise noted in this procedure report.   ESOPHAGUS: A stricture was found at the gastroesophageal junction. The stenosis was traversable with the endoscope.   STOMACH: Mild non-erosive gastritis (inflammation) was found in the gastric antrum.  Multiple biopsies were performed using cold forceps. DUODENUM: Mild duodenal inflammation was found in the duodenal bulb.   The duodenal mucosa showed no abnormalities in the bulb and second portion of the duodenum.  Cold forceps biopsies were taken in the bulb and second portion.   Dilation was then performed at the distal esophagus and proximal esophagus Dilator: Savary over guidewire Size(s): 14-16 mm Resistance: minimal Heme: yes TRACE  COMPLICATIONS: There were no immediate complications.  ENDOSCOPIC IMPRESSION: 1.   Stricture at the gastroesophageal junction 2.   NAUSEA MOST LIKELY DUE TO ATYPICAL  AND Non-erosive gastritis AND DUODENITIS  RECOMMENDATIONS: AVOID TRIGGERS FOR REFLUX and gastritis. PROTONIX 30 MINUTES PRIOR TO BREAKFAST. AWAIT BIOPSY RESULTS. FOLLOW UP IN 4 MOS.   _______________________________ eSignedWest Bali:  Tonesha Tsou L Azan Maneri, MD 03/18/2015 11:42 AM   CPT CODES: ICD CODES:  The ICD and CPT codes recommended by this software are interpretations from the data that the clinical staff has captured with the software.  The verification of the translation of this report to the ICD and CPT codes and modifiers is the sole responsibility of the health care institution and practicing physician where this report was generated.  [employer] will not be held responsible for the validity of the ICD and CPT codes included on this report.  AMA assumes no liability for data contained or not contained herein. CPT is a Publishing rights managerregistered trademark of the [employer].

## 2015-03-18 NOTE — Progress Notes (Signed)
REVIEWED-NO ADDITIONAL RECOMMENDATIONS. 

## 2015-03-18 NOTE — H&P (Addendum)
  Primary Care Physician:  Erasmo DownerStrader, Lindsey F, NP Primary Gastroenterologist:  Dr. Darrick PennaFields  Pre-Procedure History & Physical: HPI:  William Burnett is a 19 y.o. male here for DYSPEPSIA/DYSPHAGIA.  Past Medical History  Diagnosis Date  . Asthma     Past Surgical History  Procedure Laterality Date  . Wisdom tooth extraction      Prior to Admission medications   Medication Sig Start Date End Date Taking? Authorizing Provider  albuterol (VENTOLIN HFA) 108 (90 BASE) MCG/ACT inhaler Inhale 2 puffs into the lungs daily as needed. For shortness of breath    Yes Historical Provider, MD  cetirizine (ZYRTEC) 10 MG tablet Take 10 mg by mouth at bedtime.     Yes Historical Provider, MD  montelukast (SINGULAIR) 5 MG chewable tablet Chew 5 mg by mouth daily.     Yes Historical Provider, MD  pantoprazole (PROTONIX) 40 MG tablet Take 1 tablet (40 mg total) by mouth daily. 02/25/15  Yes Nira RetortAnna W Sams, NP  albuterol (PROVENTIL) (2.5 MG/3ML) 0.083% nebulizer solution Take 2.5 mg by nebulization every 6 (six) hours as needed. For shortness of breath     Historical Provider, MD    Allergies as of 02/25/2015  . (No Known Allergies)    Family History  Problem Relation Age of Onset  . Asthma    . Colon cancer Neg Hx   . Colon polyps Maternal Grandfather     Social History   Social History  . Marital Status: Single    Spouse Name: N/A  . Number of Children: N/A  . Years of Education: N/A   Occupational History  . student     [employer]   Social History Main Topics  . Smoking status: Never Smoker   . Smokeless tobacco: Not on file  . Alcohol Use: No  . Drug Use: No  . Sexual Activity: Not on file   Other Topics Concern  . Not on file   Social History Narrative    Review of Systems: See HPI, otherwise negative ROS   Physical Exam: BP 126/64 mmHg  Pulse 84  Temp(Src) 98.5 F (36.9 C) (Oral)  Resp 15  Ht 5\' 7"  (1.702 m)  Wt 130 lb (58.968 kg)  BMI 20.36 kg/m2  SpO2 98% General:    Alert,  pleasant and cooperative in NAD Head:  Normocephalic and atraumatic. Neck:  Supple; Lungs:  Clear throughout to auscultation.    Heart:  Regular rate and rhythm. Abdomen:  Soft, nontender and nondistended. Normal bowel sounds, without guarding, and without rebound.   Neurologic:  Alert and  oriented x4;  grossly normal neurologically.  Impression/Plan:     Dyspepsia/DYSPHAGIA  PLAN:  EGD/possible DIL TODAY

## 2015-03-24 ENCOUNTER — Encounter (HOSPITAL_COMMUNITY): Payer: Self-pay | Admitting: Gastroenterology

## 2015-03-30 ENCOUNTER — Telehealth: Payer: Self-pay | Admitting: Gastroenterology

## 2015-03-30 NOTE — Telephone Encounter (Signed)
PATIENT HAS FU OV °

## 2015-03-30 NOTE — Telephone Encounter (Signed)
Please call pt. His stomach Bx shows gastritis.  HIS SMALL BOWEL BIOPSIES ARE NORMAL.   AVOID things THAT TRIGGERS REFLUX and gastritis.  CONTINUE PROTONIX. TAKE 30 MINUTES PRIOR TO BREAKFAST. PLEASE CALL IN ONE MONTH IF YOUR NAUSEA/ABDOMINAL PAIN IS NOT BETTER. FOLLOW UP IN 4 MOS E30 NAUSEA/ABDOMINAL PAIN.

## 2015-03-31 NOTE — Telephone Encounter (Signed)
LMOM for a return call.  

## 2015-04-01 NOTE — Telephone Encounter (Signed)
Pt's mom came by and was informed of results.

## 2015-07-20 ENCOUNTER — Ambulatory Visit (INDEPENDENT_AMBULATORY_CARE_PROVIDER_SITE_OTHER): Payer: Medicaid Other | Admitting: Nurse Practitioner

## 2015-07-20 ENCOUNTER — Encounter: Payer: Self-pay | Admitting: Nurse Practitioner

## 2015-07-20 VITALS — BP 111/67 | HR 55 | Temp 97.1°F | Ht 66.0 in | Wt 128.6 lb

## 2015-07-20 DIAGNOSIS — R1013 Epigastric pain: Secondary | ICD-10-CM | POA: Diagnosis not present

## 2015-07-20 MED ORDER — SUCRALFATE 1 G PO TABS: 1.0000 g | ORAL_TABLET | Freq: Three times a day (TID) | ORAL | Status: DC | PRN

## 2015-07-20 NOTE — Patient Instructions (Addendum)
1. Continue taking Protonix once a day, 30 minutes before your first meal of the day.  2. Avoid foods that aggravate your symptoms. Below I am providing a list of foods and substances that are typical for aggravating symptoms such as yours. 3. I sent in a prescription to your pharmacy for Carafate 1 g pills. As we discussed, crush the pill and mix it with a little bit of water to make slurry and drink this. You can take it as needed when you have breakthrough symptoms. 4. Return for follow-up in 6 months.     Food Choices for Gastroesophageal Reflux Disease, Adult When you have gastroesophageal reflux disease (GERD), the foods you eat and your eating habits are very important. Choosing the right foods can help ease the discomfort of GERD. WHAT GENERAL GUIDELINES DO I NEED TO FOLLOW?  Choose fruits, vegetables, whole grains, low-fat dairy products, and low-fat meat, fish, and poultry.  Limit fats such as oils, salad dressings, butter, nuts, and avocado.  Keep a food diary to identify foods that cause symptoms.  Avoid foods that cause reflux. These may be different for different people.  Eat frequent small meals instead of three large meals each day.  Eat your meals slowly, in a relaxed setting.  Limit fried foods.  Cook foods using methods other than frying.  Avoid drinking alcohol.  Avoid drinking large amounts of liquids with your meals.  Avoid bending over or lying down until 2-3 hours after eating. WHAT FOODS ARE NOT RECOMMENDED? The following are some foods and drinks that may worsen your symptoms: Vegetables Tomatoes. Tomato juice. Tomato and spaghetti sauce. Chili peppers. Onion and garlic. Horseradish. Fruits Oranges, grapefruit, and lemon (fruit and juice). Meats High-fat meats, fish, and poultry. This includes hot dogs, ribs, ham, sausage, salami, and bacon. Dairy Whole milk and chocolate milk. Sour cream. Cream. Butter. Ice cream. Cream cheese.  Beverages Coffee  and tea, with or without caffeine. Carbonated beverages or energy drinks. Condiments Hot sauce. Barbecue sauce.  Sweets/Desserts Chocolate and cocoa. Donuts. Peppermint and spearmint. Fats and Oils High-fat foods, including [place] fries and potato chips. Other Vinegar. Strong spices, such as black pepper, white pepper, red pepper, cayenne, curry powder, cloves, ginger, and chili powder. The items listed above may not be a complete list of foods and beverages to avoid. Contact your dietitian for more information.   This information is not intended to replace advice given to you by your health care provider. Make sure you discuss any questions you have with your health care provider.   Document Released: 05/23/2005 Document Revised: 06/13/2014 Document Reviewed: 03/27/2013 Elsevier Interactive Patient Education [employer]

## 2015-07-20 NOTE — Progress Notes (Addendum)
Referring Provider: Erasmo Downer, NP Primary Care Physician:  Erasmo Downer, NP Primary GI:  Dr. Darrick Penna  Chief Complaint  Patient presents with  . Follow-up    HPI:   20 year old male presents for follow-up on endoscopy. He was last seen in our office 02/25/2015 for dyspepsia occluding vague symptoms of nausea after eating, occasionally sensation of "something being on my throat" and abdominal pain after eating, worse with certain foods. He was started on Protonix once daily and referred for endoscopy which was completed on 03/18/2015. Endoscopy found GE junction stricture status post dilation, nausea most likely related to atypical and non-erosive gastritis and duodenitis. Recommend avoid symptomatically triggers, her tonics once a day, await biopsy results. Pathology report on biopsies to be a chronic gastritis and normal duodenal mucosa. Recommended four-month follow-up.  Today he states he is doing better. Has some breakthrough symptoms about once a week. Will happen more often if he eats spicy foods. Still takes Protonix once a day. Breakthrough symptoms include nausea and gagging. Denies esophageal burning, bitter taste. Denies abdominal pain, hematochezia, melena, unintentional weight loss. Denies chest pain, dyspnea, dizziness, lightheadedness, syncope, near syncope. Denies any other upper or lower GI symptoms.  Past Medical History  Diagnosis Date  . Asthma     Past Surgical History  Procedure Laterality Date  . Wisdom tooth extraction    . Esophagogastroduodenoscopy N/A 03/18/2015    Procedure: ESOPHAGOGASTRODUODENOSCOPY (EGD);  Surgeon: West Bali, MD;  Location: [facility];  Service: Endoscopy;  Laterality: N/A;  0915 - moved to 9:45 - office to notify  . Savory dilation N/A 03/18/2015    Procedure: SAVORY DILATION;  Surgeon: West Bali, MD;  Location: [facility];  Service: Endoscopy;  Laterality: N/A;    Current Outpatient Prescriptions    Medication Sig Dispense Refill  . albuterol (PROVENTIL) (2.5 MG/3ML) 0.083% nebulizer solution Take 2.5 mg by nebulization every 6 (six) hours as needed. For shortness of breath     . albuterol (VENTOLIN HFA) 108 (90 BASE) MCG/ACT inhaler Inhale 2 puffs into the lungs daily as needed. For shortness of breath     . cetirizine (ZYRTEC) 10 MG tablet Take 10 mg by mouth at bedtime.      . montelukast (SINGULAIR) 5 MG chewable tablet Chew 5 mg by mouth daily.      . pantoprazole (PROTONIX) 40 MG tablet Take 1 tablet (40 mg total) by mouth daily. 30 tablet 3   No current facility-administered medications for this visit.    Allergies as of 07/20/2015  . (No Known Allergies)    Family History  Problem Relation Age of Onset  . Asthma    . Colon cancer Neg Hx   . Colon polyps Maternal Grandfather     Social History   Social History  . Marital Status: Single    Spouse Name: N/A  . Number of Children: N/A  . Years of Education: N/A   Occupational History  . student     [employer]   Social History Main Topics  . Smoking status: Never Smoker   . Smokeless tobacco: None  . Alcohol Use: No  . Drug Use: No  . Sexual Activity: Not Asked   Other Topics Concern  . None   Social History Narrative    Review of Systems: 10-point ROS negative except as per HPI.   Physical Exam: BP 111/67 mmHg  Pulse 55  Temp(Src) 97.1 F (36.2 C)  Ht  (1.676  m)  Wt 128 lb 9.6 oz (58.333 kg)  BMI 20.77 kg/m2 General:   Alert and oriented. Pleasant and cooperative. Well-nourished and well-developed.  Head:  Normocephalic and atraumatic. Eyes:  Without icterus, sclera clear and conjunctiva pink.  Cardiovascular:  S1, S2 present without murmurs appreciated. Extremities without clubbing or edema. Respiratory:  Clear to auscultation bilaterally. No wheezes, rales, or rhonchi. No distress.  Gastrointestinal:  +BS, soft, non-tender and non-distended. No HSM noted. No guarding or rebound. No masses  appreciated.  Rectal:  Deferred  Neurologic:  Alert and oriented x4;  grossly normal neurologically. Psych:  Alert and cooperative. Normal mood and affect.    07/20/2015 10:06 AM

## 2015-07-20 NOTE — Progress Notes (Signed)
cc'ed to pcp °

## 2015-07-20 NOTE — Assessment & Plan Note (Addendum)
Dyspepsia symptoms status post EGD determined to be atypical and nonerosive gastritis and duodenitis. He continues on Protonix once a day. Has occasional breakthrough symptoms although these are typically due to eating spicy foods. At this point we'll provide for Carafate 1 g pills which he can crush and mix into a slurry and take when he has breakthrough symptoms. Recommend continue to avoid triggers. Return for follow-up in 6 months.

## 2015-08-29 IMAGING — CR DG CHEST 1V PORT
1 series · 1 of 1 positions shown · non-contrast
Comparison: 04/27/2011

CLINICAL DATA: Sharp left-sided chest pain, pleuritic. Onset
tonight.

EXAM:
PORTABLE CHEST - 1 VIEW

[ap portable]
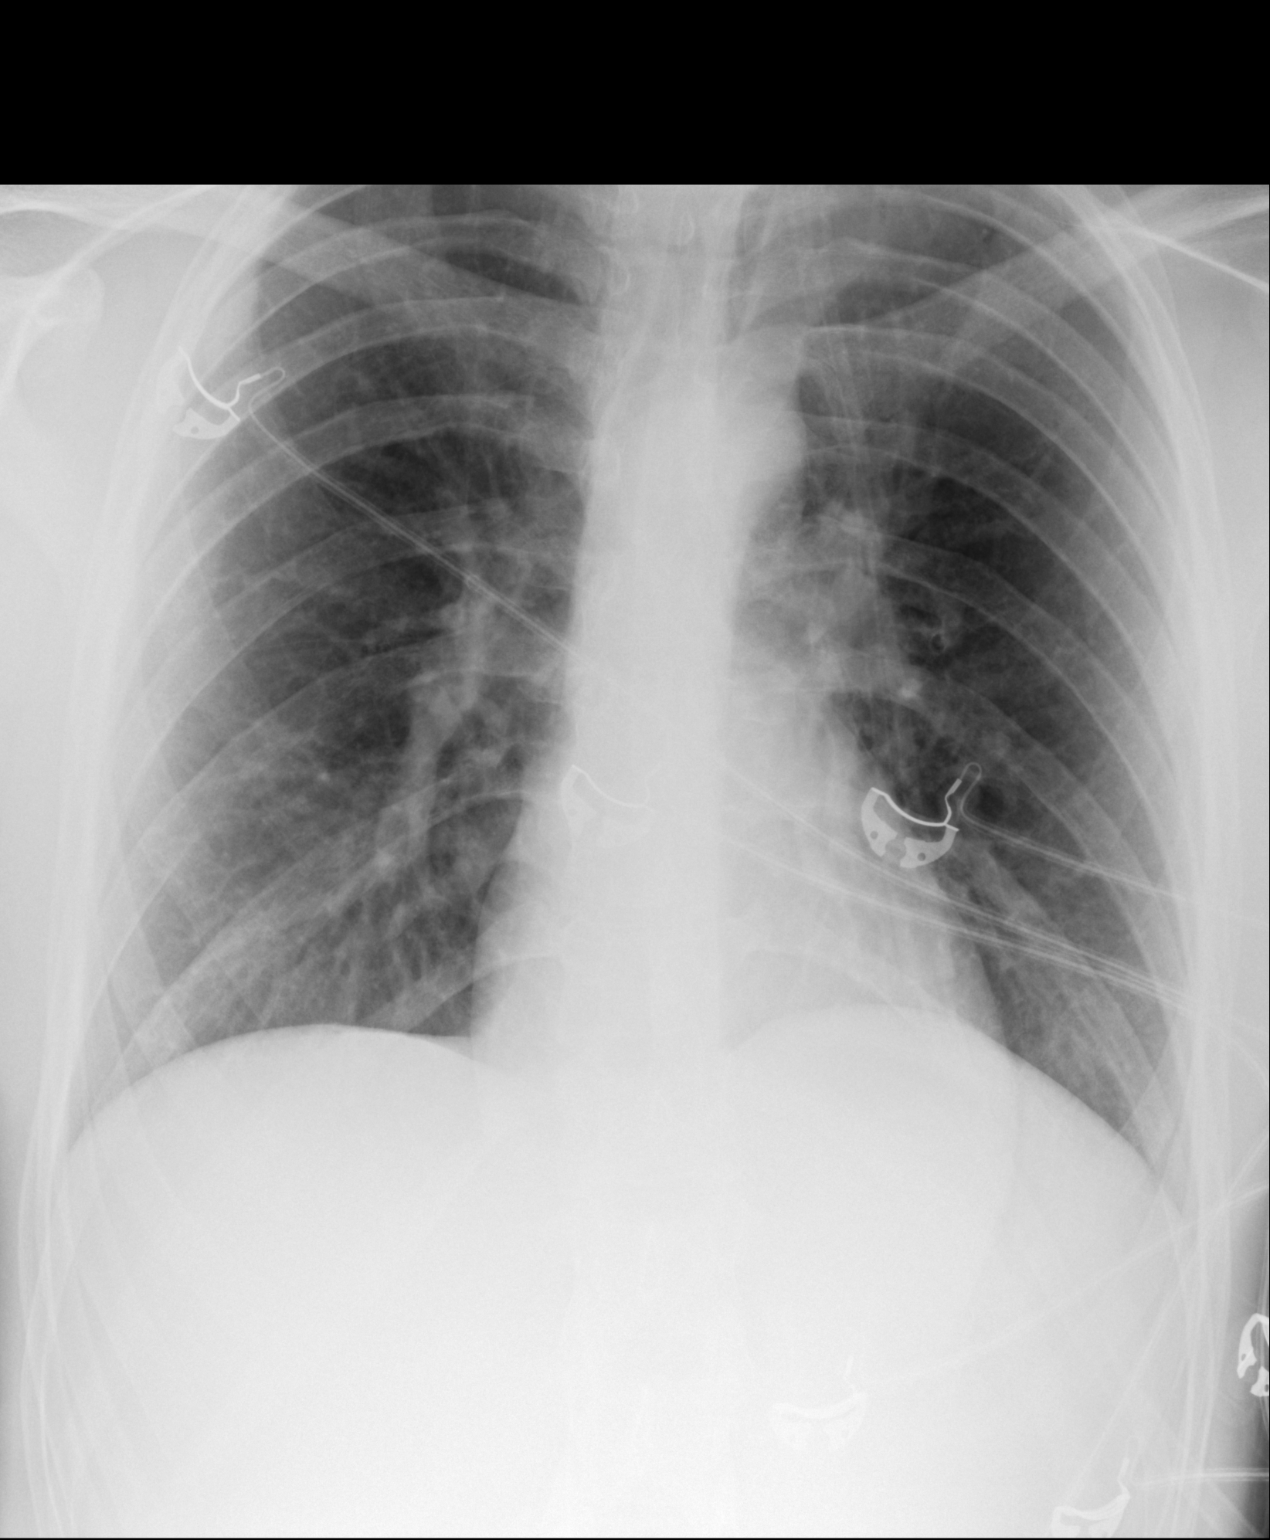

[1 of 1 positions shown; findings below may reference images not displayed]

FINDINGS: A single AP portable view of the chest demonstrates no focal
airspace consolidation or alveolar edema. The lungs are grossly
clear. There is no large effusion or pneumothorax. Cardiac and
mediastinal contours appear unremarkable.
IMPRESSION: No active disease.

## 2015-09-11 ENCOUNTER — Other Ambulatory Visit: Payer: Self-pay | Admitting: Nurse Practitioner

## 2015-09-11 ENCOUNTER — Other Ambulatory Visit: Payer: Self-pay | Admitting: Gastroenterology

## 2016-01-18 ENCOUNTER — Telehealth: Payer: Self-pay | Admitting: Nurse Practitioner

## 2016-01-18 ENCOUNTER — Ambulatory Visit: Payer: Medicaid Other | Admitting: Nurse Practitioner

## 2016-01-18 ENCOUNTER — Encounter: Payer: Self-pay | Admitting: Nurse Practitioner

## 2016-01-18 NOTE — Telephone Encounter (Signed)
PATIENT WAS A NO SHOW AND LETTER SENT  °

## 2016-01-18 NOTE — Telephone Encounter (Signed)
Noted  

## 2017-02-20 ENCOUNTER — Emergency Department
Admission: EM | Admit: 2017-02-20 | Discharge: 2017-02-20 | Disposition: A | Discharge status: Home / Self Care | Payer: Self-pay | Attending: Emergency Medicine | Admitting: Emergency Medicine

## 2017-02-20 DIAGNOSIS — J45909 Unspecified asthma, uncomplicated: Secondary | ICD-10-CM | POA: Insufficient documentation

## 2017-02-20 DIAGNOSIS — L03116 Cellulitis of left lower limb: Secondary | ICD-10-CM | POA: Insufficient documentation

## 2017-02-20 DIAGNOSIS — Z79899 Other long term (current) drug therapy: Secondary | ICD-10-CM | POA: Insufficient documentation

## 2017-02-20 MED ORDER — SULFAMETHOXAZOLE-TRIMETHOPRIM 800-160 MG PO TABS
1.0000 | ORAL_TABLET | Freq: Once | ORAL | Status: AC
  Administered 2017-02-20: 1 via ORAL
  Filled 2017-02-20: qty 1

## 2017-02-20 MED ORDER — SULFAMETHOXAZOLE-TRIMETHOPRIM 800-160 MG PO TABS: 1.0000 | ORAL_TABLET | Freq: Two times a day (BID) | ORAL | 0 refills | Status: AC

## 2017-02-20 NOTE — ED Triage Notes (Signed)
Reports noticed area to left lower leg few days ago.  Now red, swollen and painful.

## 2017-02-21 NOTE — ED Provider Notes (Signed)
[facility] Emergency Department Provider Note  ____________________________________________  Time seen: Approximately 12:05 AM  I have reviewed the triage vital signs and the nursing notes.   HISTORY  Chief Complaint Abscess and Wound Infection    HPI William Burnett is a 21 y.o. male presenting to the emergency department with a 3 cm x 3 cm region of circumferential cellulitis along the left lower leg that has been apparent for the past 3 days. She states that crusting has been visualized Patient denies fever or chills. Patient does not recall a specific mechanism of trauma. Patient has been applying Neosporin.   Past Medical History:  Diagnosis Date  . Asthma   . Gastritis and duodenitis    non-erosive    Patient Active Problem List   Diagnosis Date Noted  . Dyspepsia 03/01/2015  . Elevated LFTs 05/29/2013  . ASTHMA 04/22/2009  . CLOSED FRACTURE METACARPAL BONE SITE UNSPECIFIED 04/22/2009    Past Surgical History:  Procedure Laterality Date  . ESOPHAGOGASTRODUODENOSCOPY N/A 03/18/2015   Procedure: ESOPHAGOGASTRODUODENOSCOPY (EGD);  Surgeon: West Bali, MD;  Location: [facility];  Service: Endoscopy;  Laterality: N/A;  0915 - moved to 9:45 - office to notify  . SAVORY DILATION N/A 03/18/2015   Procedure: SAVORY DILATION;  Surgeon: West Bali, MD;  Location: [facility];  Service: Endoscopy;  Laterality: N/A;  . WISDOM TOOTH EXTRACTION      Prior to Admission medications   Medication Sig Start Date End Date Taking? Authorizing Provider  albuterol (PROVENTIL) (2.5 MG/3ML) 0.083% nebulizer solution Take 2.5 mg by nebulization every 6 (six) hours as needed. For shortness of breath     [provider]  albuterol (VENTOLIN HFA) 108 (90 BASE) MCG/ACT inhaler Inhale 2 puffs into the lungs daily as needed. For shortness of breath     [provider]  cetirizine (ZYRTEC) 10 MG tablet Take 10 mg by mouth at bedtime.       [provider]  montelukast (SINGULAIR) 5 MG chewable tablet Chew 5 mg by mouth daily.      [provider]  pantoprazole (PROTONIX) 40 MG tablet TAKE 1 TABLET BY MOUTH ONCE DAILY. 09/14/15   Gelene Mink, NP  sucralfate (CARAFATE) 1 g tablet TAKE (1) TABLET BY MOUTH THREE TIMES DAILY WITH FOOD. *CRUSH AND MIX WITH A LITTLE WATER AND DRINK* 09/14/15   Gelene Mink, NP  sulfamethoxazole-trimethoprim (BACTRIM DS,SEPTRA DS) 800-160 MG tablet Take 1 tablet by mouth 2 (two) times daily. 02/20/17 02/27/17  Orvil Feil, PA-C    Allergies Tylenol with codeine #3 [acetaminophen-codeine]  Family History  Problem Relation Age of Onset  . Asthma Unknown   . Colon cancer Neg Hx   . Colon polyps Maternal Grandfather     Social History Social History  Substance Use Topics  . Smoking status: Never Smoker  . Smokeless tobacco: Never Used  . Alcohol use No     Review of Systems  Constitutional: No fever/chills Eyes: No visual changes. No discharge ENT: No upper respiratory complaints. Cardiovascular: no chest pain. Respiratory: no cough. No SOB. Musculoskeletal: Negative for musculoskeletal pain. Skin: Patient has cellulitis    ____________________________________________   PHYSICAL EXAM:  VITAL SIGNS: ED Triage Vitals  Enc Vitals Group     BP 02/20/17 2012 118/75     Pulse Rate 02/20/17 2012 84     Resp 02/20/17 2012 18     Temp 02/20/17 2012 97.6 F (36.4 C)  Temp Source 02/20/17 2012 Oral     SpO2 02/20/17 2012 99 %     Weight 02/20/17 2009 137 lb (62.1 kg)     Height 02/20/17 2009  (1.676 m)     Head Circumference --      Peak Flow --      Pain Score 02/20/17 2009 5     Pain Loc --      Pain Edu? --      Excl. in GC? --      Constitutional: Alert and oriented. Well appearing and in no acute distress. Eyes: Conjunctivae are normal. PERRL. EOMI. Head: Atraumatic.  Cardiovascular: Normal rate, regular rhythm. Normal S1 and S2.  Good  peripheral circulation. Respiratory: Normal respiratory effort without tachypnea or retractions. Lungs CTAB. Good air entry to the bases with no decreased or absent breath sounds. Musculoskeletal: Full range of motion to all extremities. No gross deformities appreciated. Neurologic:  Normal speech and language. No gross focal neurologic deficits are appreciated.  Skin: Patient has a 3 cm x 3 cm region of circumferential cellulitis with central crusting visualized. No induration or palpable fluctuance. Psychiatric: Mood and affect are normal. Speech and behavior are normal. Patient exhibits appropriate insight and judgement.   ____________________________________________   LABS (all labs ordered are listed, but only abnormal results are displayed)  Labs Reviewed - No data to display ____________________________________________  EKG   ____________________________________________  RADIOLOGY   No results found.  ____________________________________________    PROCEDURES  Procedure(s) performed:    Procedures    Medications  sulfamethoxazole-trimethoprim (BACTRIM DS,SEPTRA DS) 800-160 MG per tablet 1 tablet (1 tablet Oral Given 02/20/17 2257)     ____________________________________________   INITIAL IMPRESSION / ASSESSMENT AND PLAN / ED COURSE  Pertinent labs & imaging results that were available during my care of the patient were reviewed by me and considered in my medical decision making (see chart for details).  Review of the [place] CSRS was performed in accordance of the [facility] prior to dispensing any controlled drugs.     Assessment and plan Cellulitis Patient presents to the emergency department with a 3 cm x 3 cm region of circumferential cellulitis visualized along the left lower leg. Patient was discharged with Bactrim. Bactrim was provided in the emergency department. Patient was advised follow-up with primary care as needed. Vital signs are reassuring prior  to discharge. All patient questions were answered.   ____________________________________________  FINAL CLINICAL IMPRESSION(S) / ED DIAGNOSES  Final diagnoses:  Cellulitis of left lower extremity      NEW MEDICATIONS STARTED DURING THIS VISIT:  Discharge Medication List as of 02/20/2017 10:44 PM    START taking these medications   Details  sulfamethoxazole-trimethoprim (BACTRIM DS,SEPTRA DS) 800-160 MG tablet Take 1 tablet by mouth 2 (two) times daily., Starting Mon 02/20/2017, Until Mon 02/27/2017, Print            This chart was dictated using voice recognition software/Dragon. Despite best efforts to proofread, errors can occur which can change the meaning. Any change was purely unintentional.    Orvil Feil, PA-C 02/21/17 Rowe Clack, [place], MD 02/22/17 2230
